# Patient Record
Sex: Male | Born: 1975 | Race: White | Hispanic: No | Marital: Single | Smoking: Former smoker
Health system: Southern US, Community
[De-identification: ages and names within clinical notes are randomized; demographics above are authoritative.]

## PROBLEM LIST (undated history)

## (undated) DIAGNOSIS — F419 Anxiety disorder, unspecified: Secondary | ICD-10-CM

## (undated) DIAGNOSIS — F329 Major depressive disorder, single episode, unspecified: Secondary | ICD-10-CM

## (undated) DIAGNOSIS — F32A Depression, unspecified: Secondary | ICD-10-CM

## (undated) DIAGNOSIS — R079 Chest pain, unspecified: Secondary | ICD-10-CM

## (undated) HISTORY — DX: Chest pain, unspecified: R07.9

## (undated) HISTORY — DX: Major depressive disorder, single episode, unspecified: F32.9

## (undated) HISTORY — DX: Anxiety disorder, unspecified: F41.9

## (undated) HISTORY — DX: Depression, unspecified: F32.A

---

## 2015-09-06 ENCOUNTER — Ambulatory Visit (INDEPENDENT_AMBULATORY_CARE_PROVIDER_SITE_OTHER): Payer: Self-pay

## 2015-09-06 ENCOUNTER — Encounter (HOSPITAL_COMMUNITY): Payer: Self-pay | Admitting: Emergency Medicine

## 2015-09-06 ENCOUNTER — Ambulatory Visit (HOSPITAL_COMMUNITY)
Admission: EM | Admit: 2015-09-06 | Discharge: 2015-09-06 | Disposition: A | Discharge status: Home / Self Care | Payer: Self-pay | Attending: Family Medicine | Admitting: Family Medicine

## 2015-09-06 DIAGNOSIS — S93401A Sprain of unspecified ligament of right ankle, initial encounter: Secondary | ICD-10-CM

## 2015-09-06 MED ORDER — IBUPROFEN 800 MG PO TABS: 800.0000 mg | ORAL_TABLET | Freq: Three times a day (TID) | ORAL | Status: DC

## 2015-09-06 NOTE — Discharge Instructions (Signed)
Wear ankle support as needed for comfort, activity as tolerated. advil medicine as needed, return or see orthopedist if further problems. °

## 2015-09-06 NOTE — ED Notes (Signed)
Ankle pain , onset 4/17

## 2015-09-06 NOTE — ED Provider Notes (Signed)
CSN: [id number]     Arrival date & time 09/06/15  1301 History   First MD Initiated Contact with Patient 09/06/15 1338     Chief Complaint  Patient presents with  . Ankle Pain   (Consider location/radiation/quality/duration/timing/severity/associated sxs/prior Treatment) Patient is a 40 y.o. male presenting with ankle pain. The history is provided by the patient.  Ankle Pain Location:  Ankle Time since incident:  2 days Injury: yes   Mechanism of injury: fall   Fall:    Fall occurred:  Down stairs (twisted mon eve.) Ankle location:  R ankle   History reviewed. No pertinent past medical history. History reviewed. No pertinent past surgical history. No family history on file. Social History  Substance Use Topics  . Smoking status: Current Every Day Smoker  . Smokeless tobacco: None  . Alcohol Use: Yes    Review of Systems  Constitutional: Negative.   Musculoskeletal: Positive for joint swelling and gait problem.  Skin: Positive for color change.  All other systems reviewed and are negative.   Allergies  Review of patient's allergies indicates no known allergies.  Home Medications   Prior to Admission medications   Medication Sig Start Date End Date Taking? Authorizing Provider  ibuprofen (ADVIL,MOTRIN) 800 MG tablet Take 1 tablet (800 mg total) by mouth 3 (three) times daily. 09/06/15   Linna HoffJames D Kindl, MD   Meds Ordered and Administered this Visit  Medications - No data to display  BP 131/79 mmHg  Pulse 103  Temp(Src) 99.2 F (37.3 C) (Oral)  Resp 18  SpO2 97% No data found.   Physical Exam  Constitutional: He is oriented to person, place, and time. He appears well-developed and well-nourished.  Musculoskeletal: He exhibits edema and tenderness.       Right ankle: He exhibits decreased range of motion, swelling, ecchymosis and deformity. He exhibits normal pulse. Tenderness. Lateral malleolus and medial malleolus tenderness found. No head of 5th metatarsal and  no proximal fibula tenderness found. Achilles tendon normal.  Neurological: He is alert and oriented to person, place, and time.  Skin: Skin is warm and dry.  Nursing note and vitals reviewed.   ED Course  Procedures (including critical care time)  Labs Review Labs Reviewed - No data to display  Imaging Review Dg Ankle Complete Right  09/06/2015  CLINICAL DATA:  Rolled ankle, lateral ankle pain/ swelling EXAM: RIGHT ANKLE - COMPLETE 3+ VIEW COMPARISON:  None. FINDINGS: No fracture or dislocation is seen. The ankle mortise is intact. The base of the fifth metatarsal is unremarkable. Moderate lateral soft tissue swelling. IMPRESSION: No fracture or dislocation is seen. Moderate lateral soft tissue swelling. Electronically Signed   By: Charline BillsSriyesh  Krishnan M.D.   On: 09/06/2015 13:54   X-rays reviewed and report per radiologist.   Visual Acuity Review  Right Eye Distance:   Left Eye Distance:   Bilateral Distance:    Right Eye Near:   Left Eye Near:    Bilateral Near:         MDM   1. Right ankle sprain, initial encounter        Linna HoffJames D Kindl, MD 09/06/15 [phone]

## 2016-02-22 ENCOUNTER — Encounter (HOSPITAL_COMMUNITY): Payer: Self-pay | Admitting: Emergency Medicine

## 2016-02-22 ENCOUNTER — Ambulatory Visit (INDEPENDENT_AMBULATORY_CARE_PROVIDER_SITE_OTHER): Payer: 59

## 2016-02-22 ENCOUNTER — Ambulatory Visit (HOSPITAL_COMMUNITY)
Admission: EM | Admit: 2016-02-22 | Discharge: 2016-02-22 | Disposition: A | Discharge status: Home / Self Care | Payer: 59 | Attending: Internal Medicine | Admitting: Internal Medicine

## 2016-02-22 DIAGNOSIS — J209 Acute bronchitis, unspecified: Secondary | ICD-10-CM

## 2016-02-22 DIAGNOSIS — R69 Illness, unspecified: Secondary | ICD-10-CM

## 2016-02-22 DIAGNOSIS — J111 Influenza due to unidentified influenza virus with other respiratory manifestations: Secondary | ICD-10-CM

## 2016-02-22 MED ORDER — AZITHROMYCIN 250 MG PO TABS: 250.0000 mg | ORAL_TABLET | Freq: Every day | ORAL | 0 refills | Status: DC

## 2016-02-22 MED ORDER — ALBUTEROL SULFATE HFA 108 (90 BASE) MCG/ACT IN AERS: 2.0000 | INHALATION_SPRAY | RESPIRATORY_TRACT | 0 refills | Status: DC | PRN

## 2016-02-22 MED ORDER — PREDNISONE 50 MG PO TABS
50.0000 mg | ORAL_TABLET | Freq: Every day | ORAL | 0 refills | Status: DC
Start: 2016-02-22 — End: 2022-07-25

## 2016-02-22 NOTE — Discharge Instructions (Addendum)
Symptoms of fever and cough today seem most suggestive of bronchitis, although chest xray cannot completely exclude an early right middle lobe pneumonia.  Prescriptions for zithromax (antibiotic), prednisone (for cough) and albuterol (for chest tightness) were sent to the [facility] on [address].  Recheck for increasing fever, increasing phlegm production, or if not starting to improve in a few days.  Anticipate gradual improvement in cough/chest tightness over the next week or two.

## 2016-02-22 NOTE — ED Notes (Signed)
Pt d/c by Dr. Murray 

## 2016-02-22 NOTE — ED Triage Notes (Signed)
Pt is here for cold sx onset 4 days associated w/BA, cough, back pain, congestion, sneezing, HA, and fevers  Reports mother and GF are being treated for pneumonia  A&O x4.... NAD

## 2016-02-22 NOTE — ED Provider Notes (Signed)
[facility]    CSN: [id number] Arrival date & time: 02/22/16  1924     History   Chief Complaint Chief Complaint  Patient presents with  . URI    HPI Ronny BaconMarshal D Cheever is a 40 y.o. male. He presents today with a 3 day history of cough, fever, chest tightness and mild dyspnea. Wife and mother-in-law were recently diagnosed with pneumonia, treated as outpatients.  HPI  History reviewed. No pertinent past medical history.  History reviewed. No pertinent surgical history.    Home Medications        Takes no meds regularly   Family History No family history on file.  Social History Social History  Substance Use Topics  . Smoking status: Current Every Day Smoker  . Smokeless tobacco: Never Used  . Alcohol use Yes     Allergies   Review of patient's allergies indicates no known allergies.   Review of Systems Review of Systems  All other systems reviewed and are negative.    Physical Exam  Updated Vital Signs BP 128/93 (BP Location: Left Arm)   Pulse (!) 133   Temp 100.2 F (37.9 C) (Oral)   Resp 18   SpO2 95%  Physical Exam  Constitutional: He is oriented to person, place, and time.  Alert, nicely groomed Looks ill but not toxic  HENT:  Head: Atraumatic.  Bilateral TMs are moderately dull, no erythema Moderate nasal congestion with mucopurulent material present Throat is red with postnasal drainage  Eyes:  Conjugate gaze, no eye redness/drainage  Neck: Neck supple.  Cardiovascular: Regular rhythm.   Heart rate 130s  Pulmonary/Chest: No respiratory distress. He has no wheezes.  Coarse but symmetric breath sounds throughout Diminished posteriorly Slightly increased respiratory effort  Abdominal: He exhibits no distension.  Musculoskeletal: Normal range of motion.  Neurological: He is alert and oriented to person, place, and time.  Skin: Skin is warm and dry.  No cyanosis  Nursing note and vitals reviewed.    UC Treatments /  Results   Radiology Dg Chest 2 View  Result Date: 02/22/2016 CLINICAL DATA:  Sick for 2 days with cough, low-grade fever, head and chest congestion, LEFT chest pain, headache, smoker EXAM: CHEST  2 VIEW COMPARISON:  None. FINDINGS: Normal heart size, mediastinal contours, and pulmonary vascularity. Azygos fissure. Emphysematous and bronchitic changes consistent with COPD. Poor definition of the RIGHT heart border with slightly increased RIGHT middle lobe markings cannot exclude acute infiltrate. Remaining lungs clear. No pleural effusion or pneumothorax. Bones appear demineralized. IMPRESSION: COPD changes with questionable RIGHT middle lobe infiltrate. Electronically Signed   By: Ulyses SouthwardMark  Boles M.D.   On: 02/22/2016 21:11    Procedures Procedures (including critical care time)      None today  Final Clinical Impressions(s) / UC Diagnoses   Final diagnoses:  Acute bronchitis, unspecified organism  Influenza-like illness   Symptoms of fever and cough today seem most suggestive of bronchitis, although chest xray cannot completely exclude an early right middle lobe pneumonia.  Prescriptions for zithromax (antibiotic), prednisone (for cough) and albuterol (for chest tightness) [facility] [address]  Recheck for increasing fever, increasing phlegm production, or if not starting to improve in a few days.  Anticipate gradual improvement in cough/chest tightness over the next week or two.    New Prescriptions New Prescriptions   ALBUTEROL (PROVENTIL HFA;VENTOLIN HFA) 108 (90 BASE) MCG/ACT INHALER    Inhale 2 puffs into the lungs every 4 (four) hours as  needed for wheezing or shortness of breath.   AZITHROMYCIN (ZITHROMAX) 250 MG TABLET    Take 1 tablet (250 mg total) by mouth daily. Take first 2 tablets together, then 1 every day until finished.   PREDNISONE (DELTASONE) 50 MG TABLET    Take 1 tablet (50 mg total) by mouth daily.     Eustace Moore, MD 02/24/16 2155

## 2017-09-09 ENCOUNTER — Ambulatory Visit (INDEPENDENT_AMBULATORY_CARE_PROVIDER_SITE_OTHER): Payer: Managed Care, Other (non HMO)

## 2017-09-09 ENCOUNTER — Other Ambulatory Visit: Payer: Self-pay

## 2017-09-09 ENCOUNTER — Encounter: Payer: Self-pay | Admitting: Emergency Medicine

## 2017-09-09 ENCOUNTER — Ambulatory Visit: Payer: Managed Care, Other (non HMO) | Admitting: Emergency Medicine

## 2017-09-09 VITALS — BP 100/62 | HR 81 | Temp 98.4°F | Resp 16 | Ht 71.0 in | Wt 166.0 lb

## 2017-09-09 DIAGNOSIS — M62838 Other muscle spasm: Secondary | ICD-10-CM | POA: Diagnosis not present

## 2017-09-09 DIAGNOSIS — M542 Cervicalgia: Secondary | ICD-10-CM | POA: Diagnosis not present

## 2017-09-09 DIAGNOSIS — G5681 Other specified mononeuropathies of right upper limb: Secondary | ICD-10-CM | POA: Diagnosis not present

## 2017-09-09 DIAGNOSIS — M5030 Other cervical disc degeneration, unspecified cervical region: Secondary | ICD-10-CM | POA: Diagnosis not present

## 2017-09-09 MED ORDER — DICLOFENAC SODIUM 75 MG PO TBEC: 75.0000 mg | DELAYED_RELEASE_TABLET | Freq: Two times a day (BID) | ORAL | 0 refills | Status: AC

## 2017-09-09 MED ORDER — CYCLOBENZAPRINE HCL 10 MG PO TABS: 10.0000 mg | ORAL_TABLET | Freq: Every day | ORAL | 0 refills | Status: DC

## 2017-09-09 NOTE — Progress Notes (Signed)
Jon Gibson 42 y.o.   Chief Complaint  Patient presents with  . Neck Pain    RIGHT side to RIGHT shoulder x 4 days    HISTORY OF PRESENT ILLNESS: This is a 42 y.o. male complaining of chronic right sided neck pain for at least 7 years.  Has not seen an orthopedist or chiropractor.  No diagnostic imaging ever.  Denies recent trauma.  Complaining of similar pain with radiation into the right shoulder for the past 4 days.  No other associated symptomatology.  Pain is sharp and steady.  HPI   Prior to Admission medications   Medication Sig Start Date End Date Taking? Authorizing Provider  albuterol (PROVENTIL HFA;VENTOLIN HFA) 108 (90 Base) MCG/ACT inhaler Inhale 2 puffs into the lungs every 4 (four) hours as needed for wheezing or shortness of breath. Patient not taking: Reported on 09/09/2017 02/22/16   Isa Rankin, MD  azithromycin (ZITHROMAX) 250 MG tablet Take 1 tablet (250 mg total) by mouth daily. Take first 2 tablets together, then 1 every day until finished. Patient not taking: Reported on 09/09/2017 02/22/16   Isa Rankin, MD  ibuprofen (ADVIL,MOTRIN) 800 MG tablet Take 1 tablet (800 mg total) by mouth 3 (three) times daily. Patient not taking: Reported on 09/09/2017 09/06/15   Linna Hoff, MD  predniSONE (DELTASONE) 50 MG tablet Take 1 tablet (50 mg total) by mouth daily. Patient not taking: Reported on 09/09/2017 02/22/16   Isa Rankin, MD    No Known Allergies  There are no active problems to display for this patient.   No past medical history on file.  No past surgical history on file.  Social History   Socioeconomic History  . Marital status: Single    Spouse name: Not on file  . Number of children: Not on file  . Years of education: Not on file  . Highest education level: Not on file  Occupational History  . Not on file  Social Needs  . Financial resource strain: Not on file  . Food insecurity:    Worry: Not on file   Inability: Not on file  . Transportation needs:    Medical: Not on file    Non-medical: Not on file  Tobacco Use  . Smoking status: Current Every Day Smoker  . Smokeless tobacco: Never Used  Substance and Sexual Activity  . Alcohol use: Yes  . Drug use: Not on file  . Sexual activity: Not on file  Lifestyle  . Physical activity:    Days per week: Not on file    Minutes per session: Not on file  . Stress: Not on file  Relationships  . Social connections:    Talks on phone: Not on file    Gets together: Not on file    Attends religious service: Not on file    Active member of club or organization: Not on file    Attends meetings of clubs or organizations: Not on file    Relationship status: Not on file  . Intimate partner violence:    Fear of current or ex partner: Not on file    Emotionally abused: Not on file    Physically abused: Not on file    Forced sexual activity: Not on file  Other Topics Concern  . Not on file  Social History Narrative  . Not on file    No family history on file.   Review of Systems  Constitutional: Negative.  Negative for chills,  fever and weight loss.  HENT: Negative.  Negative for sore throat.   Eyes: Negative.  Negative for blurred vision.  Respiratory: Negative.  Negative for cough and shortness of breath.   Cardiovascular: Negative.  Negative for chest pain, palpitations and leg swelling.  Gastrointestinal: Negative.  Negative for abdominal pain, diarrhea, nausea and vomiting.  Genitourinary: Negative.  Negative for dysuria and hematuria.  Musculoskeletal: Positive for neck pain.  Skin: Negative.  Negative for rash.  Neurological: Negative.  Negative for dizziness, sensory change, speech change, focal weakness, weakness and headaches.  Endo/Heme/Allergies: Negative.   All other systems reviewed and are negative.   Vitals:   09/09/17 1106  BP: 100/62  Pulse: 81  Resp: 16  Temp: 98.4 F (36.9 C)  SpO2: 98%    Physical Exam    Constitutional: He is oriented to person, place, and time. He appears well-developed and well-nourished.  HENT:  Head: Normocephalic and atraumatic.  Right Ear: External ear normal.  Left Ear: External ear normal.  Nose: Nose normal.  Mouth/Throat: Oropharynx is clear and moist.  Eyes: Pupils are equal, round, and reactive to light. Conjunctivae and EOM are normal.  Neck: Muscular tenderness present. No spinous process tenderness present. Carotid bruit is not present. Decreased range of motion present. No edema and no erythema present. No thyromegaly present.  Cardiovascular: Normal rate, regular rhythm and normal heart sounds.  Pulmonary/Chest: Effort normal and breath sounds normal.  Abdominal: Soft. There is no tenderness.  Neurological: He is alert and oriented to person, place, and time. No cranial nerve deficit or sensory deficit. He exhibits normal muscle tone.  Skin: Skin is warm and dry. Capillary refill takes less than 2 seconds. No rash noted.  Psychiatric: He has a normal mood and affect. His behavior is normal.  Vitals reviewed.   Dg Cervical Spine 2 Or 3 Views  Result Date: 09/09/2017 CLINICAL DATA:  Neck pain EXAM: CERVICAL SPINE - 2-3 VIEW COMPARISON:  None. FINDINGS: The cervical vertebrae are slightly straightened alignment. There is mild degenerative disc disease at C5-6, where there is slight loss of disc space and mild sclerosis with spurring. No prevertebral soft tissue swelling is seen. No fracture is noted. The odontoid process is intact. The lung apices are clear. IMPRESSION: Slightly straightened alignment. Mild degenerative disc disease at C5-6. No acute abnormality. Electronically Signed   By: Dwyane DeePaul  Barry M.D.   On: 09/09/2017 12:04   A total of 30 minutes was spent in the room with the patient, greater than 50% of which was in counseling/coordination of care regarding further diagnostic imaging and possible orthopedic referral.  Therapeutic options discussed with  the patient along with proper management.  ASSESSMENT & PLAN: Trudie BucklerMarshal was seen today for neck pain.  Diagnoses and all orders for this visit:  Neck pain -     DG Cervical Spine 2 or 3 views; Future -     MR Cervical Spine Wo Contrast; Future -     diclofenac (VOLTAREN) 75 MG EC tablet; Take 1 tablet (75 mg total) by mouth 2 (two) times daily for 5 days.  Pinched nerve in shoulder, right  Other cervical disc degeneration, unspecified cervical region -     MR Cervical Spine Wo Contrast; Future  Cervicalgia -     MR Cervical Spine Wo Contrast; Future  Muscle spasm -     cyclobenzaprine (FLEXERIL) 10 MG tablet; Take 1 tablet (10 mg total) by mouth at bedtime.    Patient Instructions  IF you received an x-ray today, you will receive an invoice from [facility]. Please contact [facility] at [phone] with questions or concerns regarding your invoice.   IF you received labwork today, you will receive an invoice from [facility]. Please contact [facility] at [phone] with questions or concerns regarding your invoice.   Our billing staff will not be able to assist you with questions regarding bills from these companies.  You will be contacted with the lab results as soon as they are available. The fastest way to get your results is to activate your My Chart account. Instructions are located on the last page of this paperwork. If you have not heard from [place] regarding the results in 2 weeks, please contact this office.     Cervical Radiculopathy Cervical radiculopathy means that a nerve in the neck is pinched or bruised. This can cause pain or loss of feeling (numbness) that runs from your neck to your arm and fingers. Follow these instructions at home: Managing pain  Take over-the-counter and prescription medicines only as told by your doctor.  If directed, put ice on the injured or painful area. ? Put ice in a plastic bag. ? Place a towel between your  skin and the bag. ? Leave the ice on for 20 minutes, 2-3 times per day.  If ice does not help, you can try using heat. Take a warm shower or warm bath, or use a heat pack as told by your doctor.  You may try a gentle neck and shoulder massage. Activity  Rest as needed. Follow instructions from your doctor about any activities to avoid.  Do exercises as told by your doctor or physical therapist. General instructions  If you were given a soft collar, wear it as told by your doctor.  Use a flat pillow when you sleep.  Keep all follow-up visits as told by your doctor. This is important. Contact a doctor if:  Your condition does not improve with treatment. Get help right away if:  Your pain gets worse and is not controlled with medicine.  You lose feeling or feel weak in your hand, arm, face, or leg.  You have a fever.  You have a stiff neck.  You cannot control when you poop or pee (have incontinence).  You have trouble with walking, balance, or talking. This information is not intended to replace advice given to you by your health care provider. Make sure you discuss any questions you have with your health care provider. Document Released: 04/25/2011 Document Revised: 10/12/2015 Document Reviewed: 06/30/2014 Elsevier Interactive Patient Education  2018 [employer].      Edwina Barth, MD [facility]

## 2017-09-09 NOTE — Patient Instructions (Addendum)
     IF you received an x-ray today, you will receive an invoice from [facility]. Please contact [facility] at [phone] with questions or concerns regarding your invoice.   IF you received labwork today, you will receive an invoice from [facility]. Please contact [facility] at [phone] with questions or concerns regarding your invoice.   Our billing staff will not be able to assist you with questions regarding bills from these companies.  You will be contacted with the lab results as soon as they are available. The fastest way to get your results is to activate your My Chart account. Instructions are located on the last page of this paperwork. If you have not heard from us regarding the results in 2 weeks, please contact this office.     Cervical Radiculopathy Cervical radiculopathy means that a nerve in the neck is pinched or bruised. This can cause pain or loss of feeling (numbness) that runs from your neck to your arm and fingers. Follow these instructions at home: Managing pain  Take over-the-counter and prescription medicines only as told by your doctor.  If directed, put ice on the injured or painful area. ? Put ice in a plastic bag. ? Place a towel between your skin and the bag. ? Leave the ice on for 20 minutes, 2-3 times per day.  If ice does not help, you can try using heat. Take a warm shower or warm bath, or use a heat pack as told by your doctor.  You may try a gentle neck and shoulder massage. Activity  Rest as needed. Follow instructions from your doctor about any activities to avoid.  Do exercises as told by your doctor or physical therapist. General instructions  If you were given a soft collar, wear it as told by your doctor.  Use a flat pillow when you sleep.  Keep all follow-up visits as told by your doctor. This is important. Contact a doctor if:  Your condition does not improve with treatment. Get help right away if:  Your  pain gets worse and is not controlled with medicine.  You lose feeling or feel weak in your hand, arm, face, or leg.  You have a fever.  You have a stiff neck.  You cannot control when you poop or pee (have incontinence).  You have trouble with walking, balance, or talking. This information is not intended to replace advice given to you by your health care provider. Make sure you discuss any questions you have with your health care provider. Document Released: 04/25/2011 Document Revised: 10/12/2015 Document Reviewed: 06/30/2014 Elsevier Interactive Patient Education  2018 [employer]  

## 2017-09-16 ENCOUNTER — Telehealth: Payer: Self-pay | Admitting: Emergency Medicine

## 2017-09-16 NOTE — Telephone Encounter (Signed)
Tried to receive prior auth for pt mri but insurance need more clinical info which I faxed over.. Waiting for an answer from them.

## 2017-11-01 ENCOUNTER — Telehealth: Payer: Self-pay | Admitting: Emergency Medicine

## 2017-11-01 NOTE — Telephone Encounter (Signed)
[employer] has denied MRI of the cervical spine w/o contrast for this pt.  Provider may do a peer to peer to appeal this denial

## 2018-12-06 IMAGING — DX DG CERVICAL SPINE 2 OR 3 VIEWS
2 series · 2 of 2 positions shown · non-contrast
Comparison: None.

CLINICAL DATA: Neck pain

EXAM:
CERVICAL SPINE - 2-3 VIEW

[c-spine lat]
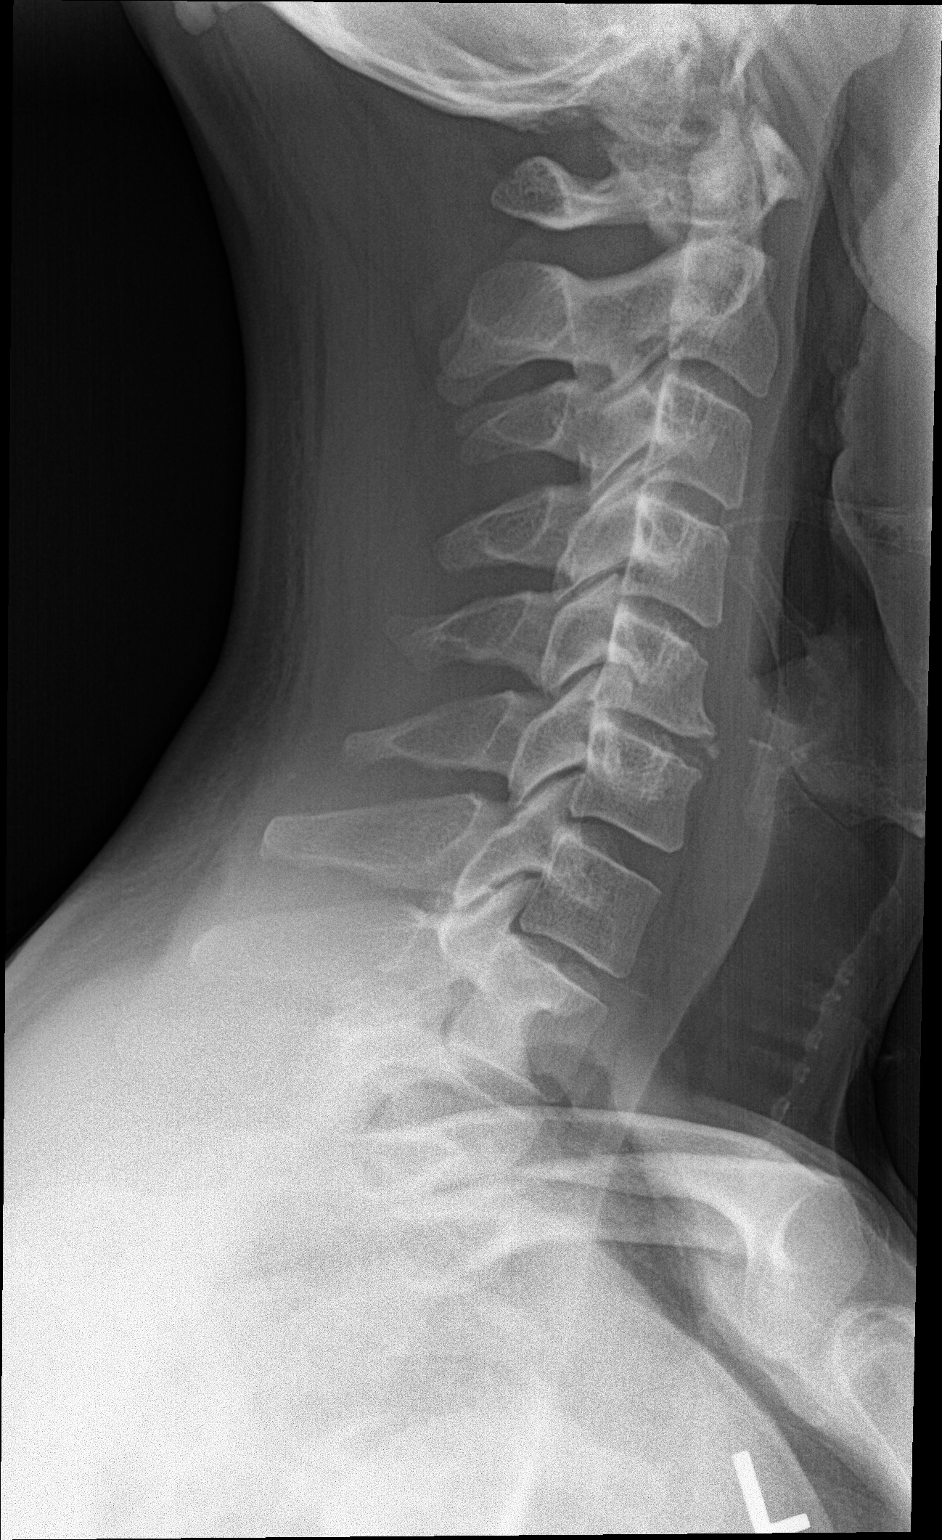

[c-spine ap]
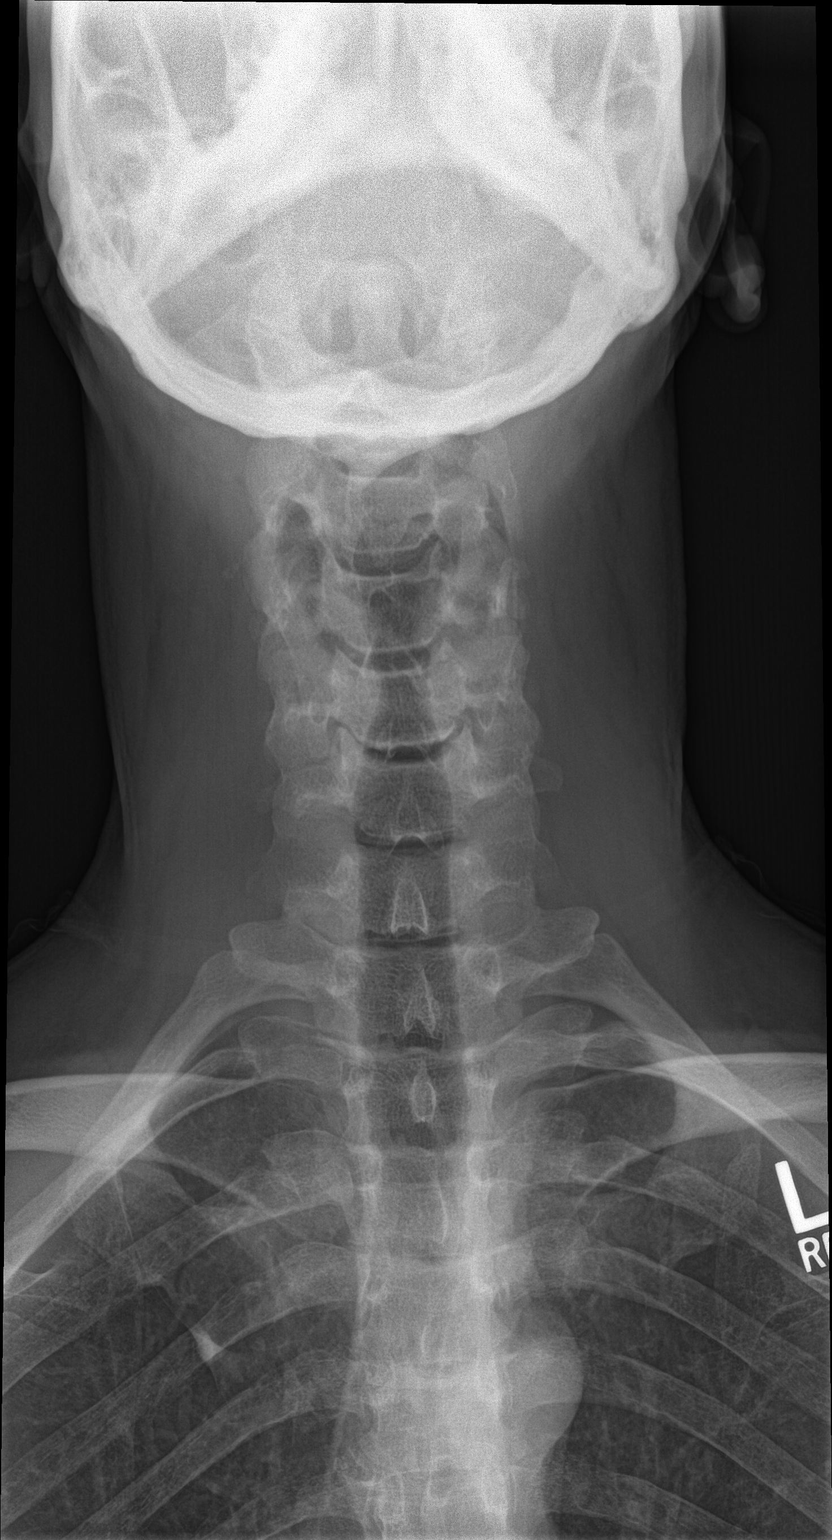

[2 of 2 positions shown; findings below may reference images not displayed]

FINDINGS: The cervical vertebrae are slightly straightened alignment. There is
mild degenerative disc disease at C5-6, where there is slight loss
of disc space and mild sclerosis with spurring. No prevertebral soft
tissue swelling is seen. No fracture is noted. The odontoid process
is intact. The lung apices are clear.
IMPRESSION: Slightly straightened alignment. Mild degenerative disc disease at
C5-6. No acute abnormality.

## 2022-07-25 ENCOUNTER — Ambulatory Visit: Payer: Managed Care, Other (non HMO) | Admitting: Family Medicine

## 2022-07-25 ENCOUNTER — Encounter: Payer: Self-pay | Admitting: Family Medicine

## 2022-07-25 ENCOUNTER — Ambulatory Visit: Payer: Managed Care, Other (non HMO)

## 2022-07-25 VITALS — BP 114/76 | HR 93 | Temp 97.8°F | Ht 70.0 in | Wt 214.0 lb

## 2022-07-25 DIAGNOSIS — R079 Chest pain, unspecified: Secondary | ICD-10-CM | POA: Diagnosis not present

## 2022-07-25 DIAGNOSIS — Z7689 Persons encountering health services in other specified circumstances: Secondary | ICD-10-CM | POA: Diagnosis not present

## 2022-07-25 DIAGNOSIS — E669 Obesity, unspecified: Secondary | ICD-10-CM | POA: Diagnosis not present

## 2022-07-25 DIAGNOSIS — Z87891 Personal history of nicotine dependence: Secondary | ICD-10-CM

## 2022-07-25 DIAGNOSIS — K219 Gastro-esophageal reflux disease without esophagitis: Secondary | ICD-10-CM

## 2022-07-25 DIAGNOSIS — Z8249 Family history of ischemic heart disease and other diseases of the circulatory system: Secondary | ICD-10-CM

## 2022-07-25 DIAGNOSIS — R9431 Abnormal electrocardiogram [ECG] [EKG]: Secondary | ICD-10-CM

## 2022-07-25 LAB — LDL CHOLESTEROL, DIRECT: Direct LDL: 118 mg/dL

## 2022-07-25 LAB — CBC WITH DIFFERENTIAL/PLATELET
Basophils Absolute: 0.1 10*3/uL (ref 0.0–0.1)
Basophils Relative: 1.3 % (ref 0.0–3.0)
Eosinophils Absolute: 0.2 10*3/uL (ref 0.0–0.7)
Eosinophils Relative: 3.1 % (ref 0.0–5.0)
HCT: 46.1 % (ref 39.0–52.0)
Hemoglobin: 15.6 g/dL (ref 13.0–17.0)
Lymphocytes Relative: 32.1 % (ref 12.0–46.0)
Lymphs Abs: 2.4 10*3/uL (ref 0.7–4.0)
MCHC: 33.8 g/dL (ref 30.0–36.0)
MCV: 87.5 fl (ref 78.0–100.0)
Monocytes Absolute: 0.6 10*3/uL (ref 0.1–1.0)
Monocytes Relative: 8.3 % (ref 3.0–12.0)
Neutro Abs: 4.1 10*3/uL (ref 1.4–7.7)
Neutrophils Relative %: 55.2 % (ref 43.0–77.0)
Platelets: 272 10*3/uL (ref 150.0–400.0)
RBC: 5.27 Mil/uL (ref 4.22–5.81)
RDW: 13.3 % (ref 11.5–15.5)
WBC: 7.5 10*3/uL (ref 4.0–10.5)

## 2022-07-25 LAB — COMPREHENSIVE METABOLIC PANEL
ALT: 16 U/L (ref 0–53)
AST: 16 U/L (ref 0–37)
Albumin: 4.2 g/dL (ref 3.5–5.2)
Alkaline Phosphatase: 73 U/L (ref 39–117)
BUN: 18 mg/dL (ref 6–23)
CO2: 28 mEq/L (ref 19–32)
Calcium: 9.9 mg/dL (ref 8.4–10.5)
Chloride: 102 mEq/L (ref 96–112)
Creatinine, Ser: 1.05 mg/dL (ref 0.40–1.50)
GFR: 84.62 mL/min (ref >60.00)
Glucose, Bld: 91 mg/dL (ref 70–99)
Potassium: 4.4 mEq/L (ref 3.5–5.1)
Sodium: 138 mEq/L (ref 135–145)
Total Bilirubin: 0.4 mg/dL (ref 0.2–1.2)
Total Protein: 7.3 g/dL (ref 6.0–8.3)

## 2022-07-25 LAB — LIPID PANEL
Cholesterol: 201 mg/dL — ABNORMAL HIGH (ref 0–200)
HDL: 34.4 mg/dL — ABNORMAL LOW (ref >39.00)
Total CHOL/HDL Ratio: 6
Triglycerides: 580 mg/dL — ABNORMAL HIGH (ref 0.0–149.0)

## 2022-07-25 LAB — TSH: TSH: 1.91 u[IU]/mL (ref 0.35–5.50)

## 2022-07-25 NOTE — Patient Instructions (Addendum)
We will be in touch with your lab and X ray results.   You will hear from the cardiology office to schedule a visit.   If you have chest pain, feel like your heart is beating abnormally, shortness of breath, dizziness, or any other concerning symptoms, call 911 or go to the emergency department.

## 2022-07-25 NOTE — Progress Notes (Signed)
New Patient Office Visit  Subjective    Patient ID: Jon Gibson, male    DOB: 1975-08-24  Age: 47 y.o. MRN: [id number]  CC:  Chief Complaint  Patient presents with   Establish Care    Stomach issues back in Feb, no longer a problem right now.     HPI Jon Gibson presents to establish care  Denies any regular medical care in years.   C/o intermittent mid sternal chest pain that radiated to his jaw in January. This was recurrent for 1-2 weeks and then resolved.   MGF with MI in 53s.   He also had abdominal pain which he thought was diverticulitis. He went to urgent care and was prescribed antibiotics.  Hx of GERD.   Denies fever, chills, dizziness,  palpitations, shortness of breath, N/V/D, urinary symptoms, LE edema.    Stopped smoking 2 years ago.  Drinks 6-7 beers one day per week. States he used to be a heavy drinker.   Works for [employer], Network engineer job.     Outpatient Encounter Medications as of 07/25/2022  Medication Sig   Ascorbic Acid (VITAMIN C PO) Take by mouth.   MAGNESIUM PO Take by mouth.   MILK THISTLE PO Take by mouth.   Multiple Vitamins-Minerals (ZINC PO) Take by mouth.   VITAMIN A PO Take by mouth.   VITAMIN D, CHOLECALCIFEROL, PO Take by mouth.   [DISCONTINUED] albuterol (PROVENTIL HFA;VENTOLIN HFA) 108 (90 Base) MCG/ACT inhaler Inhale 2 puffs into the lungs every 4 (four) hours as needed for wheezing or shortness of breath. (Patient not taking: Reported on 09/09/2017)   [DISCONTINUED] azithromycin (ZITHROMAX) 250 MG tablet Take 1 tablet (250 mg total) by mouth daily. Take first 2 tablets together, then 1 every day until finished. (Patient not taking: Reported on 09/09/2017)   [DISCONTINUED] cyclobenzaprine (FLEXERIL) 10 MG tablet Take 1 tablet (10 mg total) by mouth at bedtime. (Patient not taking: Reported on 07/25/2022)   [DISCONTINUED] ibuprofen (ADVIL,MOTRIN) 800 MG tablet Take 1 tablet (800 mg total) by mouth 3 (three) times daily. (Patient  not taking: Reported on 09/09/2017)   [DISCONTINUED] predniSONE (DELTASONE) 50 MG tablet Take 1 tablet (50 mg total) by mouth daily. (Patient not taking: Reported on 09/09/2017)   No facility-administered encounter medications on file as of 07/25/2022.    Past Medical History:  Diagnosis Date   Anxiety    Depression    Gastroesophageal reflux disease 07/25/2022    History reviewed. No pertinent surgical history.  Family History  Problem Relation Age of Onset   Healthy Mother    Healthy Sister     Social History   Socioeconomic History   Marital status: Single    Spouse name: Not on file   Number of children: Not on file   Years of education: Not on file   Highest education level: Not on file  Occupational History   Not on file  Tobacco Use   Smoking status: Former    Packs/day: 1.00    Years: 25.00    Total pack years: 25.00    Types: Cigarettes    Quit date: 06/29/2020    Years since quitting: 2.0   Smokeless tobacco: Never  Substance and Sexual Activity   Alcohol use: Yes    Alcohol/week: 6.0 - 7.0 standard drinks of alcohol    Types: 6 - 7 Cans of beer per week    Comment: Once a week   Drug use: Never   Sexual activity: Not  Currently    Birth control/protection: None  Other Topics Concern   Not on file  Social History Narrative   Not on file   Social Determinants of Health   Financial Resource Strain: Not on file  Food Insecurity: Not on file  Transportation Needs: Not on file  Physical Activity: Not on file  Stress: Not on file  Social Connections: Not on file  Intimate Partner Violence: Not on file    ROS      Objective    BP 114/76 (BP Location: Left Arm, Patient Position: Sitting, Cuff Size: Large)   Pulse 93   Temp 97.8 F (36.6 C) (Temporal)   Ht '5\' 10"'$  (1.778 m)   Wt 214 lb (97.1 kg)   SpO2 97%   BMI 30.71 kg/m   Physical Exam Constitutional:      General: He is not in acute distress.    Appearance: He is not ill-appearing.   HENT:     Mouth/Throat:     Mouth: Mucous membranes are moist.  Eyes:     Extraocular Movements: Extraocular movements intact.     Conjunctiva/sclera: Conjunctivae normal.  Cardiovascular:     Rate and Rhythm: Normal rate and regular rhythm.  Pulmonary:     Effort: Pulmonary effort is normal.     Breath sounds: Normal breath sounds.  Musculoskeletal:     Cervical back: Normal range of motion and neck supple.     Right lower leg: No edema.     Left lower leg: No edema.  Skin:    General: Skin is warm and dry.  Neurological:     General: No focal deficit present.     Mental Status: He is alert and oriented to person, place, and time.     Cranial Nerves: No cranial nerve deficit.     Motor: No weakness.  Psychiatric:        Mood and Affect: Mood normal.        Behavior: Behavior normal.        Thought Content: Thought content normal.         Assessment & Plan:   Problem List Items Addressed This Visit       Digestive   Gastroesophageal reflux disease     Other   Abnormal EKG   Relevant Orders   Ambulatory referral to Cardiology   Chest pain - Primary   Relevant Orders   CBC with Differential/Platelet   Comprehensive metabolic panel   EKG [id number]   DG Chest 2 View   Ambulatory referral to Cardiology   Family history of heart disease   Relevant Orders   Ambulatory referral to Cardiology   Former smoker   Relevant Orders   DG Chest 2 View   Obesity (BMI 30.0-34.9)   Relevant Orders   Lipid panel   Hemoglobin A1c   TSH   Other Visit Diagnoses     Encounter to establish care          He is a pleasant 47 year old male who is new to the practice here to establish care. No recent medical care.  He reports having a 1 to 2-week issue with chest pain that radiated to his jaw.  Denies any chest pain since the end of January 2024.  EKG shows NSR, rate 88, Q waves present in inferior leads.  Check labs. CXR ordered.  Refer to cardiology for further evaluation.    No follow-ups on file.   Harland Dingwall, NP-C

## 2022-07-26 LAB — HEMOGLOBIN A1C: Hgb A1c MFr Bld: 6 % (ref 4.6–6.5)

## 2022-07-26 NOTE — Progress Notes (Signed)
Please reach out to him with the following and schedule a lab visit if needed : Your triglycerides are significantly elevated at 580 and LDL (the bad cholesterol) is slightly elevated at 118. You also have prediabetes (not diabetes).  Did you have anything to eat or drink except water for at least 6 hours before your visit yesterday? If so, we need to get you on medication for your cholesterol and triglycerides. If you were not fasting, please schedule a lab visit to come back in for a repeat cholesterol check.

## 2022-08-21 ENCOUNTER — Encounter: Payer: Self-pay | Admitting: Interventional Cardiology

## 2022-08-21 ENCOUNTER — Ambulatory Visit: Payer: Managed Care, Other (non HMO) | Attending: Interventional Cardiology | Admitting: Interventional Cardiology

## 2022-08-21 VITALS — BP 112/76 | HR 81 | Ht 70.0 in | Wt 204.4 lb

## 2022-08-21 DIAGNOSIS — Z87891 Personal history of nicotine dependence: Secondary | ICD-10-CM | POA: Diagnosis not present

## 2022-08-21 DIAGNOSIS — R072 Precordial pain: Secondary | ICD-10-CM | POA: Diagnosis not present

## 2022-08-21 DIAGNOSIS — Z8249 Family history of ischemic heart disease and other diseases of the circulatory system: Secondary | ICD-10-CM

## 2022-08-21 DIAGNOSIS — R9431 Abnormal electrocardiogram [ECG] [EKG]: Secondary | ICD-10-CM | POA: Diagnosis not present

## 2022-08-21 NOTE — Patient Instructions (Signed)
Medication Instructions:  Your physician recommends that you continue on your current medications as directed. Please refer to the Current Medication list given to you today.  *If you need a refill on your cardiac medications before your next appointment, please call your pharmacy*   Lab Work: none If you have labs (blood work) drawn today and your tests are completely normal, you will receive your results only by: [place] (if you have MyChart) OR A paper copy in the mail If you have any lab test that is abnormal or we need to change your treatment, we will call you to review the results.   Testing/Procedures: none   Follow-Up: At [facility], you and your health needs are our priority.  As part of our continuing mission to provide you with exceptional heart care, we have created designated Provider Care Teams.  These Care Teams include your primary Cardiologist (physician) and Advanced Practice Providers (APPs -  Physician Assistants and Nurse Practitioners) who all work together to provide you with the care you need, when you need it.  We recommend signing up for the patient portal called "MyChart".  Sign up information is provided on this After Visit Summary.  MyChart is used to connect with patients for Virtual Visits (Telemedicine).  Patients are able to view lab/test results, encounter notes, upcoming appointments, etc.  Non-urgent messages can be sent to your provider as well.   To learn more about what you can do with MyChart, go to [email or web address].    Your next appointment:   As needed  Provider:   Larae Grooms, MD     Other Instructions Let us know if you would like to proceed with any testing

## 2022-08-21 NOTE — Progress Notes (Signed)
Cardiology Office Note   Date:  08/21/2022   ID:  GURSHAWN Gibson, DOB 1975/12/24, MRN [id number]  PCP:  Girtha Rm, NP-C    No chief complaint on file.  Abnormal ECG, Chest pain, family h/o CAD  Wt Readings from Last 3 Encounters:  08/21/22 204 lb 6.4 oz (92.7 kg)  07/25/22 214 lb (97.1 kg)  09/09/17 166 lb (75.3 kg)       History of Present Illness: Jon Gibson is a 47 y.o. male who is being seen today for the evaluation of chest pain at the request of Gibson, Jon L, NP-C.   Occurred a few months ago for the first time.  Radiated to left face after he had eaten lunch.  Not related to activity.  Lasted 20 minutes.   A few days later, a similar episode after lunch.  Resolved.  A few days later, he had another episode which resolved with drinking beer.   Since then, no further episodes.  He has improved his diet.  Eating more fruits and vegetables.  Decreased sugar intake. Drinks more water, less sweet tea.  Eating more fish.    He has lost weight.  He walks more and meditates. No sx with walking.  Walking for a mile with no sx.   Past Medical History:  Diagnosis Date   Anxiety    Chest pain    Depression    Gastroesophageal reflux disease 07/25/2022    History reviewed. No pertinent surgical history.   Current Outpatient Medications  Medication Sig Dispense Refill   Ascorbic Acid (VITAMIN C PO) Take by mouth.     MAGNESIUM PO Take by mouth.     MILK THISTLE PO Take by mouth.     Multiple Vitamins-Minerals (ZINC PO) Take by mouth.     VITAMIN A PO Take by mouth.     VITAMIN D, CHOLECALCIFEROL, PO Take by mouth.     No current facility-administered medications for this visit.    Allergies:   Patient has no known allergies.    Social History:  The patient  reports that he quit smoking about 2 years ago. His smoking use included cigarettes. He has a 25.00 pack-year smoking history. He has never used smokeless tobacco. He reports current  alcohol use of about 6.0 - 7.0 standard drinks of alcohol per week. He reports that he does not use drugs.   Family History:  The patient's family history includes Healthy in his mother and sister.    ROS:  Please see the history of present illness.   Otherwise, review of systems are positive for intentional weight loss.   All other systems are reviewed and negative.    PHYSICAL EXAM: VS:  BP 112/76   Pulse 81   Ht 5\' 10"  (1.778 m)   Wt 204 lb 6.4 oz (92.7 kg)   SpO2 95%   BMI 29.33 kg/m  , BMI Body mass index is 29.33 kg/m. GEN: Well nourished, well developed, in no acute distress HEENT: normal Neck: no JVD, carotid bruits, or masses Cardiac: RRR; no murmurs, rubs, or gallops,no edema  Respiratory:  clear to auscultation bilaterally, normal work of breathing GI: soft, nontender, nondistended, + BS MS: no deformity or atrophy Skin: warm and dry, no rash Neuro:  Strength and sensation are intact Psych: euthymic mood, full affect   EKG:   The ekg ordered today demonstrates NSR, inferior Q waves   Recent Labs: 07/25/2022: ALT 16; BUN 18; Creatinine, Ser  1.05; Hemoglobin 15.6; Platelets 272.0; Potassium 4.4; Sodium 138; TSH 1.91   Lipid Panel    Component Value Date/Time   CHOL 201 (H) 07/25/2022 1614   TRIG (H) 07/25/2022 1614    580.0 Triglyceride is over 400; calculations on Lipids are invalid.   HDL 34.40 (L) 07/25/2022 1614   CHOLHDL 6 07/25/2022 1614   LDLDIRECT 118.0 07/25/2022 1614     Other studies Reviewed: Additional studies/ records that were reviewed today with results demonstrating: labs reviewed- TC 201, HDl 34.   ASSESSMENT AND PLAN:  Chest pain: Sx have resolved.  Discussed a CTA to eval for CAD.  Since sx have resolved, he prefers to defer doing CTA.  In general, he has tried to stay away from medical testing.  He is "not in to needles."  He will let us know if symptoms return and we can reconsider testing at that time. Abnormal ECG: inferior Q waves.   No old ECG for comparison.  We discussed ECG abnormality but for now will hold off on testing. We discussed echo as well to avoid needles.  Family h/o CAD: Grandfather with MI in his 98s. Former smoker: quit 2 years ago.  Smoked for 30 years. He wants to discuss any testing with his aunt who is in the healthcare field prior to scheduling anything.  He will let us know what he decides.    Current medicines are reviewed at length with the patient today.  The patient concerns regarding his medicines were addressed.  The following changes have been made:  No change  Labs/ tests ordered today include:  No orders of the defined types were placed in this encounter.   Recommend 150 minutes/week of aerobic exercise Low fat, low carb, high fiber diet recommended  Disposition:   FU as needed   Signed, Larae Grooms, MD  08/21/2022 3:05 PM    [facility] [address], [place], [place]  [address] Phone: [phone]; Fax: [phone]

## 2022-08-29 ENCOUNTER — Encounter: Payer: Self-pay | Admitting: Family Medicine

## 2022-08-29 ENCOUNTER — Ambulatory Visit: Payer: Managed Care, Other (non HMO) | Admitting: Family Medicine

## 2022-08-29 VITALS — BP 116/84 | HR 85 | Temp 97.8°F | Ht 70.0 in | Wt 205.0 lb

## 2022-08-29 DIAGNOSIS — R7303 Prediabetes: Secondary | ICD-10-CM

## 2022-08-29 DIAGNOSIS — Z5320 Procedure and treatment not carried out because of patient's decision for unspecified reasons: Secondary | ICD-10-CM | POA: Diagnosis not present

## 2022-08-29 DIAGNOSIS — E781 Pure hyperglyceridemia: Secondary | ICD-10-CM | POA: Diagnosis not present

## 2022-08-29 LAB — LIPID PANEL
Cholesterol: 193 mg/dL (ref 0–200)
HDL: 34 mg/dL — ABNORMAL LOW (ref >39.00)
LDL Cholesterol: 127 mg/dL — ABNORMAL HIGH (ref 0–99)
NonHDL: 158.7
Total CHOL/HDL Ratio: 6
Triglycerides: 157 mg/dL — ABNORMAL HIGH (ref 0.0–149.0)
VLDL: 31.4 mg/dL (ref 0.0–40.0)

## 2022-08-29 LAB — BASIC METABOLIC PANEL
BUN: 17 mg/dL (ref 6–23)
CO2: 26 mEq/L (ref 19–32)
Calcium: 9.6 mg/dL (ref 8.4–10.5)
Chloride: 103 mEq/L (ref 96–112)
Creatinine, Ser: 0.98 mg/dL (ref 0.40–1.50)
GFR: 91.86 mL/min (ref >60.00)
Glucose, Bld: 106 mg/dL — ABNORMAL HIGH (ref 70–99)
Potassium: 4.3 mEq/L (ref 3.5–5.1)
Sodium: 138 mEq/L (ref 135–145)

## 2022-08-29 NOTE — Progress Notes (Signed)
Subjective:     Patient ID: Jon Gibson, male    DOB: [birth year], 47 y.o.   MRN: [id number]  Chief Complaint  Patient presents with   Medical Management of Chronic Issues    Fasting 4 week f/u recheck cholesterol     HPI Patient is in today for follow up on elevated trigs, LDL and prediabetes.  He saw cardiology. No further testing needed per patient.  States he feels better than ever.   Walks 1.5 miles everyday  Changed his diet and is eating habits. Cut alcohol and most sugars and processed foods.   No more chest pain.   Health Maintenance Due  Topic Date Due   COVID-19 Vaccine (1) Never done   HIV Screening  Never done   Hepatitis C Screening  Never done   DTaP/Tdap/Td (1 - Tdap) Never done   COLONOSCOPY (Pts 45-69yrs Insurance coverage will need to be confirmed)  Never done    Past Medical History:  Diagnosis Date   Anxiety    Chest pain    Depression    Gastroesophageal reflux disease 07/25/2022   Hypertriglyceridemia 08/29/2022    History reviewed. No pertinent surgical history.  Family History  Problem Relation Age of Onset   Healthy Mother    Healthy Sister     Social History   Socioeconomic History   Marital status: Single    Spouse name: Not on file   Number of children: Not on file   Years of education: Not on file   Highest education level: Not on file  Occupational History   Not on file  Tobacco Use   Smoking status: Former    Packs/day: 1.00    Years: 25.00    Additional pack years: 0.00    Total pack years: 25.00    Types: Cigarettes    Quit date: 06/29/2020    Years since quitting: 2.1   Smokeless tobacco: Never  Substance and Sexual Activity   Alcohol use: Yes    Alcohol/week: 6.0 - 7.0 standard drinks of alcohol    Types: 6 - 7 Cans of beer per week    Comment: Once a week   Drug use: Never   Sexual activity: Not Currently    Birth control/protection: None  Other Topics Concern   Not on file  Social History  Narrative   Not on file   Social Determinants of Health   Financial Resource Strain: Patient Declined (08/25/2022)   Overall Financial Resource Strain (CARDIA)    Difficulty of Paying Living Expenses: Patient declined  Food Insecurity: Patient Declined (08/25/2022)   Hunger Vital Sign    Worried About Running Out of Food in the Last Year: Patient declined    Ran Out of Food in the Last Year: Patient declined  Transportation Needs: Patient Declined (08/25/2022)   PRAPARE - Transportation    Lack of Transportation (Medical): Patient declined    Lack of Transportation (Non-Medical): Patient declined  Physical Activity: Sufficiently Active (08/25/2022)   Exercise Vital Sign    Days of Exercise per Week: 7 days    Minutes of Exercise per Session: 150+ min  Stress: No Stress Concern Present (08/25/2022)   [employer] of Occupational Health - Occupational Stress Questionnaire    Feeling of Stress : Only a little  Social Connections: Unknown (08/25/2022)   Social Connection and Isolation Panel [NHANES]    Frequency of Communication with Friends and Family: Patient declined    Frequency of Social Gatherings with Friends  and Family: Patient declined    Attends Religious Services: Patient declined    Active Member of Clubs or Organizations: No    Attends Engineer, structural: Not on file    Marital Status: Never married  Intimate Partner Violence: Not on file    Outpatient Medications Prior to Visit  Medication Sig Dispense Refill   Ascorbic Acid (VITAMIN C PO) Take by mouth.     MAGNESIUM PO Take by mouth.     MILK THISTLE PO Take by mouth.     Multiple Vitamins-Minerals (ZINC PO) Take by mouth.     VITAMIN A PO Take by mouth.     VITAMIN D, CHOLECALCIFEROL, PO Take by mouth.     No facility-administered medications prior to visit.    No Known Allergies  Review of Systems  Constitutional:  Negative for chills and fever.  Respiratory:  Negative for shortness of breath.    Cardiovascular:  Negative for chest pain, palpitations and leg swelling.  Gastrointestinal:  Negative for abdominal pain, constipation, diarrhea, nausea and vomiting.  Genitourinary:  Negative for dysuria, frequency and urgency.  Neurological:  Negative for dizziness.       Objective:    Physical Exam Constitutional:      General: He is not in acute distress.    Appearance: He is not ill-appearing.  Eyes:     Extraocular Movements: Extraocular movements intact.     Conjunctiva/sclera: Conjunctivae normal.  Cardiovascular:     Rate and Rhythm: Normal rate.  Pulmonary:     Effort: Pulmonary effort is normal.  Musculoskeletal:     Cervical back: Normal range of motion.  Skin:    General: Skin is warm and dry.  Neurological:     General: No focal deficit present.     Mental Status: He is alert and oriented to person, place, and time.  Psychiatric:        Mood and Affect: Mood normal.        Behavior: Behavior normal.        Thought Content: Thought content normal.     BP 116/84 (BP Location: Left Arm, Patient Position: Sitting, Cuff Size: Large)   Pulse 85   Temp 97.8 F (36.6 C) (Temporal)   Ht 5\' 10"  (1.778 m)   Wt 205 lb (93 kg)   SpO2 98%   BMI 29.41 kg/m  Wt Readings from Last 3 Encounters:  08/29/22 205 lb (93 kg)  08/21/22 204 lb 6.4 oz (92.7 kg)  07/25/22 214 lb (97.1 kg)       Assessment & Plan:   Problem List Items Addressed This Visit       Other   Colon cancer screening declined   Hypertriglyceridemia - Primary   Relevant Orders   Lipid panel   Prediabetes   Relevant Orders   Basic metabolic panel   Here for a fasting follow-up due to elevated triglycerides at his previous visit when he was not fasting.  His LDL was also elevated.  A1c 6.0%.  We reviewed all of this.  Congratulated him on making healthy lifestyle changes and encouraged him to keep up the good work.  We discussed that he is due for colon cancer screening and encouraged him to  schedule a preventive/wellness visit. Check labs and follow-up.  I am having Lonia Chimera. Power maintain his (VITAMIN D, CHOLECALCIFEROL, PO), MAGNESIUM PO, MILK THISTLE PO, Ascorbic Acid (VITAMIN C PO), VITAMIN A PO, and Multiple Vitamins-Minerals (ZINC PO).  No orders of  the defined types were placed in this encounter.

## 2022-08-29 NOTE — Patient Instructions (Addendum)
Keep up the good work. We will be in touch with your lab results.    You are due for colon cancer screening. Let me know if you would like for me to refer you to GI or order a Cologuard test.    I encourage you to schedule a preventive health care visit/wellness visit at your convenience.   Colorectal Cancer Screening  Colorectal cancer screening is a group of tests that are used to check for colorectal cancer before symptoms develop. Colorectal refers to the colon and rectum. The colon and rectum are located at the end of the digestive tract and carry stool (feces) out of the body. Who should have screening? All adults who are 7-90 years old should have screening. Your health care provider may recommend screening before age 20. You will have tests every 1-10 years, depending on your results and the type of screening test. Screening recommendations for adults who are 37-18 years old vary depending on a person's health. People older than age 56 should no longer get colorectal cancer screening. You may have screening tests starting before age 65, or more often than other people, if you have any of these risk factors: A personal or family history of colorectal cancer or abnormal growths known as polyps in your colon. Inflammatory bowel disease, such as ulcerative colitis or Crohn's disease. A history of having radiation treatment to the abdomen or the area between the hip bones (pelvic area) for cancer. A type of genetic syndrome that is passed from parent to child (hereditary), such as: Lynch syndrome. Familial adenomatous polyposis. Turcot syndrome. Peutz-Jeghers syndrome. MUTYH-associated polyposis (MAP). A personal history of diabetes. Types of tests There are several types of colorectal screening tests. You may have one or more of the following: Guaiac-based fecal occult blood testing. For this test, a stool sample is checked for hidden (occult) blood, which could be a sign of colorectal  cancer. Fecal immunochemical test (FIT). For this test, a stool sample is checked for blood, which could be a sign of colorectal cancer. Stool DNA test. For this test, a stool sample is checked for blood and changes in DNA that could lead to colorectal cancer. Sigmoidoscopy. During this test, a thin, flexible tube with a camera on the end, called a sigmoidoscope, is used to examine the rectum and the lower colon. Colonoscopy. During this test, a long, flexible tube with a camera on the end, called a colonoscope, is used to examine the entire colon and rectum. Also, sometimes a tissue sample is taken to be looked at under a microscope (biopsy) or small polyps are removed during this test. Virtual colonoscopy. Instead of a colonoscope, this type of colonoscopy uses a CT scan to take pictures of the colon and rectum. A CT scan is a type of X-ray that is made using computers. What are the benefits of screening? Screening reduces your risk for colorectal cancer and can help identify cancer at an early stage, when the cancer can be removed or treated more easily. It is common for polyps to form in the lining of the colon, especially as you age. These polyps may be cancerous or become cancerous over time. Screening can identify these polyps. What are the risks of screening? Generally, these are safe tests. However, problems may occur, including: The need for more tests to confirm results from a stool sample test. Stool sample tests have fewer risks than other types of screening tests. Being exposed to low levels of radiation, if you had  a test involving X-rays. This may slightly increase your cancer risk. The benefit of detecting cancer outweighs the slight increase in risk. Bleeding, damage to the intestine, or infection caused by a sigmoidoscopy or colonoscopy. A reaction to medicines given during a sigmoidoscopy or colonoscopy. Talk with your health care provider to understand your risk for colorectal  cancer and to make a screening plan that is right for you. Questions to ask your health care provider When should I start colorectal cancer screening? What is my risk for colorectal cancer? How often do I need screening? Which screening tests do I need? How do I get my test results? What do my results mean? Where to find more information Learn more about colorectal cancer screening from: The American Cancer Society: cancer.org National Cancer Institute: cancer.gov Summary Colorectal cancer screening is a group of tests used to check for colorectal cancer before symptoms develop. All adults who are 91-35 years old should have screening. Your health care provider may recommend screening before age 70. You may have screening tests starting before age 63, or more often than other people, if you have certain risk factors. Screening reduces your risk for colorectal cancer and can help identify cancer at an early stage, when the cancer can be removed or treated more easily. Talk with your health care provider to understand your risk for colorectal cancer and to make a screening plan that is right for you. This information is not intended to replace advice given to you by your health care provider. Make sure you discuss any questions you have with your health care provider. Document Revised: 08/25/2019 Document Reviewed: 08/25/2019 Elsevier Patient Education  2023 [employer].

## 2022-09-26 ENCOUNTER — Encounter: Payer: Self-pay | Admitting: Family Medicine

## 2022-09-26 ENCOUNTER — Ambulatory Visit: Payer: Managed Care, Other (non HMO) | Admitting: Family Medicine

## 2022-09-26 VITALS — BP 106/72 | HR 70 | Temp 97.8°F | Ht 70.0 in | Wt 200.0 lb

## 2022-09-26 DIAGNOSIS — R1031 Right lower quadrant pain: Secondary | ICD-10-CM

## 2022-09-26 DIAGNOSIS — R109 Unspecified abdominal pain: Secondary | ICD-10-CM | POA: Diagnosis not present

## 2022-09-26 LAB — POC URINALSYSI DIPSTICK (AUTOMATED)
Bilirubin, UA: NEGATIVE
Blood, UA: NEGATIVE
Glucose, UA: NEGATIVE
Ketones, UA: NEGATIVE
Leukocytes, UA: NEGATIVE
Nitrite, UA: NEGATIVE
Protein, UA: NEGATIVE
Spec Grav, UA: 1.025 (ref 1.010–1.025)
Urobilinogen, UA: 0.2 E.U./dL
pH, UA: 6 (ref 5.0–8.0)

## 2022-09-26 MED ORDER — KETOROLAC TROMETHAMINE 60 MG/2ML IM SOLN
60.0000 mg | Freq: Once | INTRAMUSCULAR | Status: AC
  Administered 2022-09-26: 60 mg via INTRAMUSCULAR

## 2022-09-26 MED ORDER — VALACYCLOVIR HCL 1 G PO TABS: 1000.0000 mg | ORAL_TABLET | Freq: Three times a day (TID) | ORAL | 0 refills | Status: AC

## 2022-09-26 NOTE — Progress Notes (Signed)
Subjective:     Patient ID: Jon Gibson, male    DOB: 12-20-1975, 47 y.o.   MRN: [id number]  Chief Complaint  Patient presents with   Back Pain    Lower back pain, feels like something is enflamed x7 days.     Back Pain Associated symptoms include abdominal pain. Pertinent negatives include no chest pain, dysuria or fever.   Patient is in today for a 7 day hx of right flank pain. Pain started after mowing his yard. Denies injury. Pain is a burning sensation and radiates around to his right lower abdomen, constant. Pain is not affected by movement.  Denies urinary symptoms.  Bowel movement today normal and without blood.    No fever, chills, chest pain, shortness of breath, N/V/D.   No rash.   Health Maintenance Due  Topic Date Due   COVID-19 Vaccine (1) Never done   HIV Screening  Never done   Hepatitis C Screening  Never done   DTaP/Tdap/Td (1 - Tdap) Never done   COLONOSCOPY (Pts 45-5yrs Insurance coverage will need to be confirmed)  Never done    Past Medical History:  Diagnosis Date   Anxiety    Chest pain    Depression    Gastroesophageal reflux disease 07/25/2022   Hypertriglyceridemia 08/29/2022    History reviewed. No pertinent surgical history.  Family History  Problem Relation Age of Onset   Healthy Mother    Healthy Sister     Social History   Socioeconomic History   Marital status: Single    Spouse name: Not on file   Number of children: Not on file   Years of education: Not on file   Highest education level: Not on file  Occupational History   Not on file  Tobacco Use   Smoking status: Former    Packs/day: 1.00    Years: 25.00    Additional pack years: 0.00    Total pack years: 25.00    Types: Cigarettes    Quit date: 06/29/2020    Years since quitting: 2.2   Smokeless tobacco: Never  Substance and Sexual Activity   Alcohol use: Yes    Alcohol/week: 6.0 - 7.0 standard drinks of alcohol    Types: 6 - 7 Cans of beer per week     Comment: Once a week   Drug use: Never   Sexual activity: Not Currently    Birth control/protection: None  Other Topics Concern   Not on file  Social History Narrative   Not on file   Social Determinants of Health   Financial Resource Strain: Patient Declined (08/25/2022)   Overall Financial Resource Strain (CARDIA)    Difficulty of Paying Living Expenses: Patient declined  Food Insecurity: Patient Declined (08/25/2022)   Hunger Vital Sign    Worried About Running Out of Food in the Last Year: Patient declined    Ran Out of Food in the Last Year: Patient declined  Transportation Needs: Patient Declined (08/25/2022)   PRAPARE - Transportation    Lack of Transportation (Medical): Patient declined    Lack of Transportation (Non-Medical): Patient declined  Physical Activity: Sufficiently Active (08/25/2022)   Exercise Vital Sign    Days of Exercise per Week: 7 days    Minutes of Exercise per Session: 150+ min  Stress: No Stress Concern Present (08/25/2022)   [employer] of Occupational Health - Occupational Stress Questionnaire    Feeling of Stress : Only a little  Social Connections: Unknown (08/25/2022)  Social Advertising account executive [NHANES]    Frequency of Communication with Friends and Family: Patient declined    Frequency of Social Gatherings with Friends and Family: Patient declined    Attends Religious Services: Patient declined    Database administrator or Organizations: No    Attends Engineer, structural: Not on file    Marital Status: Never married  Intimate Partner Violence: Not on file    Outpatient Medications Prior to Visit  Medication Sig Dispense Refill   Ascorbic Acid (VITAMIN C PO) Take by mouth.     MAGNESIUM PO Take by mouth.     MILK THISTLE PO Take by mouth.     Multiple Vitamins-Minerals (ZINC PO) Take by mouth.     VITAMIN A PO Take by mouth.     VITAMIN D, CHOLECALCIFEROL, PO Take by mouth.     No facility-administered medications  prior to visit.    No Known Allergies  Review of Systems  Constitutional:  Negative for chills and fever.  Respiratory:  Negative for shortness of breath.   Cardiovascular:  Negative for chest pain and palpitations.  Gastrointestinal:  Positive for abdominal pain. Negative for blood in stool, constipation, diarrhea, nausea and vomiting.  Genitourinary:  Negative for dysuria, frequency and urgency.  Musculoskeletal:  Positive for back pain.  Skin:  Negative for rash.  Neurological:  Negative for dizziness.       Objective:    Physical Exam Constitutional:      General: He is not in acute distress.    Appearance: He is not ill-appearing.  HENT:     Mouth/Throat:     Mouth: Mucous membranes are moist.  Eyes:     Extraocular Movements: Extraocular movements intact.     Conjunctiva/sclera: Conjunctivae normal.  Cardiovascular:     Rate and Rhythm: Normal rate.  Pulmonary:     Effort: Pulmonary effort is normal.     Breath sounds: Normal breath sounds.  Abdominal:     General: Bowel sounds are normal. There is no distension.     Palpations: Abdomen is soft.     Tenderness: There is abdominal tenderness. There is no right CVA tenderness, left CVA tenderness, guarding or rebound.  Musculoskeletal:        General: Normal range of motion.     Cervical back: Normal range of motion.  Skin:    General: Skin is warm and dry.     Findings: No bruising, lesion or rash.  Neurological:     General: No focal deficit present.     Mental Status: He is alert and oriented to person, place, and time.     Cranial Nerves: No cranial nerve deficit.     Motor: No weakness.     Gait: Gait normal.  Psychiatric:        Mood and Affect: Mood normal.        Behavior: Behavior normal.        Thought Content: Thought content normal.     BP 106/72 (BP Location: Left Arm, Patient Position: Sitting, Cuff Size: Large)   Pulse 70   Temp 97.8 F (36.6 C) (Temporal)   Ht 5\' 10"  (1.778 m)   Wt 200  lb (90.7 kg)   SpO2 97%   BMI 28.70 kg/m  Wt Readings from Last 3 Encounters:  09/26/22 200 lb (90.7 kg)  08/29/22 205 lb (93 kg)  08/21/22 204 lb 6.4 oz (92.7 kg)       Assessment &  Plan:   Problem List Items Addressed This Visit   None Visit Diagnoses     Flank pain    -  Primary   Relevant Medications   valACYclovir (VALTREX) 1000 MG tablet   ketorolac (TORADOL) injection 60 mg (Completed)   Other Relevant Orders   POCT Urinalysis Dipstick (Automated) (Completed)   CT RENAL STONE STUDY   Right lower quadrant abdominal pain       Relevant Medications   valACYclovir (VALTREX) 1000 MG tablet   ketorolac (TORADOL) injection 60 mg (Completed)   Other Relevant Orders   CT RENAL STONE STUDY      Urinalysis dipstick negative. Discussed possible etiologies including renal stone versus shingles however there is no rash.  Unlikely musculoskeletal since pain is not affected by movement.  No injury. CT renal stone study ordered.  Prescribed valacyclovir, benefit greater than risk. Discussed pain management including Tylenol 1000 mg twice daily, heating pad and NSAIDs. Toradol 60 mg IM injection given in office. Strict precautions that if he is worsening that he will go to the emergency department.  Follow-up pending results  I am having Lonia Chimera. Hires start on valACYclovir. I am also having him maintain his (VITAMIN D, CHOLECALCIFEROL, PO), MAGNESIUM PO, MILK THISTLE PO, Ascorbic Acid (VITAMIN C PO), VITAMIN A PO, and Multiple Vitamins-Minerals (ZINC PO). We administered ketorolac.  Meds ordered this encounter  Medications   valACYclovir (VALTREX) 1000 MG tablet    Sig: Take 1 tablet (1,000 mg total) by mouth 3 (three) times daily for 7 days.    Dispense:  21 tablet    Refill:  0    Order Specific Question:   Supervising Provider    Answer:   Hillard Danker A [[id number]]   ketorolac (TORADOL) injection 60 mg

## 2022-09-26 NOTE — Patient Instructions (Addendum)
You received a Toradol 60 mg IM injection in our office.   Your symptoms are suspicious for a kidney stone. I have ordered a CT renal stone study and you should hear from [facility] to schedule this.   I am also treating you possible shingles. Take the valacyclovir to treat this.   Drink plenty of water.   If your pain gets much worse, or if you develop vomiting, fever, unable to urinate then you will need to go to the hospital.

## 2022-09-27 ENCOUNTER — Encounter: Payer: Self-pay | Admitting: Family Medicine

## 2022-09-27 ENCOUNTER — Ambulatory Visit
Admission: RE | Admit: 2022-09-27 | Discharge: 2022-09-27 | Disposition: A | Discharge status: Home / Self Care | Payer: Managed Care, Other (non HMO) | Source: Ambulatory Visit | Attending: Family Medicine | Admitting: Family Medicine

## 2022-09-27 ENCOUNTER — Other Ambulatory Visit: Payer: Self-pay | Admitting: Family Medicine

## 2022-09-27 ENCOUNTER — Telehealth: Payer: Self-pay | Admitting: Family Medicine

## 2022-09-27 DIAGNOSIS — R1031 Right lower quadrant pain: Secondary | ICD-10-CM

## 2022-09-27 DIAGNOSIS — K5792 Diverticulitis of intestine, part unspecified, without perforation or abscess without bleeding: Secondary | ICD-10-CM

## 2022-09-27 DIAGNOSIS — R109 Unspecified abdominal pain: Secondary | ICD-10-CM

## 2022-09-27 MED ORDER — AMOXICILLIN-POT CLAVULANATE 875-125 MG PO TABS: 1.0000 | ORAL_TABLET | Freq: Two times a day (BID) | ORAL | 0 refills | Status: DC

## 2022-09-27 MED ORDER — AMOXICILLIN-POT CLAVULANATE 875-125 MG PO TABS
1.0000 | ORAL_TABLET | Freq: Two times a day (BID) | ORAL | 0 refills | Status: AC
Start: 2022-09-27 — End: ?

## 2022-09-27 NOTE — Telephone Encounter (Signed)
Patients augmentin was sent to [facility] - it should have been to [facility] on [address] - Please resend

## 2022-09-27 NOTE — Telephone Encounter (Signed)
Rx re-sent to [facility] 

## 2022-09-27 NOTE — Addendum Note (Signed)
Addended by: Marinus Maw on: 09/27/2022 03:40 PM   Modules accepted: Orders

## 2022-09-27 NOTE — Progress Notes (Signed)
Please let him know that his CT shows acute diverticulitis.  I will send in an antibiotic to treat this.  His symptoms should improve over the next 2 to 3 days.  Let me know if he is having any nausea and I can send in medication for this.  I recommend a referral to GI since he is also due for colon cancer screening.  Is he okay with this?
